# Patient Record
Sex: Female | Born: 1990
Health system: Southern US, Community
[De-identification: ages and names within clinical notes are randomized; demographics above are authoritative.]

---
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
## 2010-02-12 ENCOUNTER — Ambulatory Visit: Payer: Self-pay | Admitting: Advanced Practice Midwife

## 2010-05-29 ENCOUNTER — Encounter: Payer: Self-pay | Admitting: Family Medicine

## 2010-06-20 ENCOUNTER — Encounter: Payer: Self-pay | Admitting: Family Medicine

## 2010-07-31 ENCOUNTER — Inpatient Hospital Stay: Payer: Self-pay

## 2010-08-05 ENCOUNTER — Ambulatory Visit: Payer: Self-pay | Admitting: Anesthesiology

## 2010-08-20 ENCOUNTER — Emergency Department: Payer: Self-pay | Admitting: Emergency Medicine

## 2010-09-22 ENCOUNTER — Emergency Department: Payer: Self-pay | Admitting: Unknown Physician Specialty

## 2011-06-17 ENCOUNTER — Encounter: Payer: Self-pay | Admitting: Psychiatry

## 2011-06-20 ENCOUNTER — Encounter: Payer: Self-pay | Admitting: Psychiatry

## 2011-06-27 ENCOUNTER — Emergency Department: Payer: Self-pay | Admitting: Emergency Medicine

## 2018-09-10 ENCOUNTER — Emergency Department
Admission: EM | Admit: 2018-09-10 | Discharge: 2018-09-10 | Disposition: A | Discharge status: Home / Self Care | Payer: Medicaid Other | Attending: Emergency Medicine | Admitting: Emergency Medicine

## 2018-09-10 ENCOUNTER — Encounter: Payer: Self-pay | Admitting: Emergency Medicine

## 2018-09-10 ENCOUNTER — Other Ambulatory Visit: Payer: Self-pay

## 2018-09-10 DIAGNOSIS — F17210 Nicotine dependence, cigarettes, uncomplicated: Secondary | ICD-10-CM | POA: Insufficient documentation

## 2018-09-10 DIAGNOSIS — Y92410 Unspecified street and highway as the place of occurrence of the external cause: Secondary | ICD-10-CM | POA: Insufficient documentation

## 2018-09-10 DIAGNOSIS — G44311 Acute post-traumatic headache, intractable: Secondary | ICD-10-CM | POA: Insufficient documentation

## 2018-09-10 DIAGNOSIS — M549 Dorsalgia, unspecified: Secondary | ICD-10-CM | POA: Diagnosis not present

## 2018-09-10 DIAGNOSIS — R51 Headache: Secondary | ICD-10-CM | POA: Diagnosis present

## 2018-09-10 DIAGNOSIS — M542 Cervicalgia: Secondary | ICD-10-CM | POA: Insufficient documentation

## 2018-09-10 DIAGNOSIS — Y998 Other external cause status: Secondary | ICD-10-CM | POA: Insufficient documentation

## 2018-09-10 DIAGNOSIS — Y9389 Activity, other specified: Secondary | ICD-10-CM | POA: Diagnosis not present

## 2018-09-10 MED ORDER — IBUPROFEN 800 MG PO TABS: 800.0000 mg | ORAL_TABLET | Freq: Three times a day (TID) | ORAL | 0 refills | Status: DC | PRN

## 2018-09-10 MED ORDER — METHOCARBAMOL 500 MG PO TABS: 500.0000 mg | ORAL_TABLET | Freq: Three times a day (TID) | ORAL | 0 refills | Status: DC | PRN
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
## 2019-08-24 ENCOUNTER — Ambulatory Visit: Payer: Self-pay

## 2019-08-24 ENCOUNTER — Ambulatory Visit
Admission: EM | Admit: 2019-08-24 | Discharge: 2019-08-24 | Disposition: A | Discharge status: Home / Self Care | Payer: Medicaid Other | Attending: Emergency Medicine | Admitting: Emergency Medicine

## 2019-08-24 DIAGNOSIS — J069 Acute upper respiratory infection, unspecified: Secondary | ICD-10-CM | POA: Diagnosis not present

## 2019-08-24 MED ORDER — BENZONATATE 100 MG PO CAPS: 100.0000 mg | ORAL_CAPSULE | Freq: Three times a day (TID) | ORAL | 0 refills | Status: DC | PRN

## 2019-08-24 NOTE — Discharge Instructions (Addendum)
Take the [facility] as needed for cough.    Your rapid COVID test is negative; the send-out test is pending.  You should self quarantine until your test result is back.    Follow up with your primary care provider if your symptoms are not improving.

## 2019-08-24 NOTE — ED Triage Notes (Signed)
Patient reports she was exposed to her cousin who diagnosed with a viral respiratory infection and tested negative for COVID. States she is having cough, chest tightness, and nasal congestion x3 days. Denies fever, sore throat, chest pain.

## 2019-08-24 NOTE — ED Provider Notes (Signed)
[facility]    CSN: [id number] Arrival date & time: 08/24/19  1609      History   Chief Complaint Chief Complaint  Patient presents with   Cough   Chest Tightness   Nasal Congestion    HPI Savannah Montgomery is a 29 y.o. female.   Patient presents with 3-day history of nonproductive cough, chest congestion, nasal congestion.  She denies fever, chills, ear pain, sore throat, shortness of breath, abdominal pain, vomiting, diarrhea, rash, or other symptoms.  Treatment attempted with OTC cold medication.  The history is provided by the patient.    History reviewed. No pertinent past medical history.  There are no problems to display for this patient.   History reviewed. No pertinent surgical history.  OB History   No obstetric history on file.      Home Medications    Prior to Admission medications   Medication Sig Start Date End Date Taking? Authorizing Provider  benzonatate (TESSALON) 100 MG capsule Take 1 capsule (100 mg total) by mouth 3 (three) times daily as needed for cough. 08/24/19   Mickie Bail, NP  ibuprofen (ADVIL) 800 MG tablet Take 1 tablet (800 mg total) by mouth every 8 (eight) hours as needed. 09/10/18   Lorre Munroe, NP  methocarbamol (ROBAXIN) 500 MG tablet Take 1 tablet (500 mg total) by mouth every 8 (eight) hours as needed for muscle spasms. 09/10/18   Lorre Munroe, NP    Family History History reviewed. No pertinent family history.  Social History Social History   Tobacco Use   Smoking status: Current Every Day Smoker    Packs/day: 1.00    Types: Cigarettes   Smokeless tobacco: Never Used  Substance Use Topics   Alcohol use: Not Currently   Drug use: Not on file     Allergies   Patient has no known allergies.   Review of Systems Review of Systems  Constitutional: Negative for chills and fever.  HENT: Positive for congestion, postnasal drip, rhinorrhea and sinus pressure. Negative for ear pain and sore throat.    Eyes: Negative for pain and visual disturbance.  Respiratory: Positive for cough. Negative for shortness of breath.   Cardiovascular: Negative for chest pain and palpitations.  Gastrointestinal: Negative for abdominal pain and vomiting.  Genitourinary: Negative for dysuria and hematuria.  Musculoskeletal: Negative for arthralgias and back pain.  Skin: Negative for color change and rash.  Neurological: Negative for seizures and syncope.  All other systems reviewed and are negative.    Physical Exam Triage Vital Signs ED Triage Vitals [08/24/19 1610]  Enc Vitals Group     BP      Pulse      Resp      Temp      Temp src      SpO2      Weight      Height      Head Circumference      Peak Flow      Pain Score 0     Pain Loc      Pain Edu?      Excl. in [place]?    No data found.  Updated Vital Signs BP 119/83    Pulse (!) 114    Temp 99 F (37.2 C)    Resp 16    SpO2 95%   Visual Acuity Right Eye Distance:   Left Eye Distance:   Bilateral Distance:    Right Eye Near:  Left Eye Near:    Bilateral Near:     Physical Exam Vitals and nursing note reviewed.  Constitutional:      General: She is not in acute distress.    Appearance: She is well-developed. She is not ill-appearing.  HENT:     Head: Normocephalic and atraumatic.     Right Ear: Tympanic membrane normal.     Left Ear: Tympanic membrane normal.     Nose: Nose normal.     Mouth/Throat:     Mouth: Mucous membranes are moist.     Pharynx: Oropharynx is clear.  Eyes:     Conjunctiva/sclera: Conjunctivae normal.  Cardiovascular:     Rate and Rhythm: Normal rate and regular rhythm.     Heart sounds: No murmur heard.   Pulmonary:     Effort: Pulmonary effort is normal. No respiratory distress.     Breath sounds: Normal breath sounds. No wheezing or rhonchi.  Abdominal:     Palpations: Abdomen is soft.     Tenderness: There is no abdominal tenderness. There is no guarding or rebound.  Musculoskeletal:      Cervical back: Neck supple.  Skin:    General: Skin is warm and dry.     Findings: No rash.  Neurological:     General: No focal deficit present.     Mental Status: She is alert and oriented to person, place, and time.     Gait: Gait normal.  Psychiatric:        Mood and Affect: Mood normal.        Behavior: Behavior normal.      UC Treatments / Results  Labs (all labs ordered are listed, but only abnormal results are displayed) Labs Reviewed  POC SARS CORONAVIRUS 2 AG -  ED    EKG   Radiology No results found.  Procedures Procedures (including critical care time)  Medications Ordered in UC Medications - No data to display  Initial Impression / Assessment and Plan / UC Course  I have reviewed the triage vital signs and the nursing notes.  Pertinent labs & imaging results that were available during my care of the patient were reviewed by me and considered in my medical decision making (see chart for details).   Viral URI with cough.  Treating with Tessalon Perles.  POC COVID negative; PCR pending.  Instructed patient to self quarantine until the test result is back and to take Tylenol as needed for fever/discomfort.  Instructed patient to go to the emergency department if she develops high fever, shortness of breath, severe diarrhea, or other concerning symptoms.  Patient agrees with plan of care.    Final Clinical Impressions(s) / UC Diagnoses   Final diagnoses:  Viral URI with cough     Discharge Instructions     Take the Tessalon Perles as needed for cough.    Your rapid COVID test is negative; the send-out test is pending.  You should self quarantine until your test result is back.    Follow up with your primary care provider if your symptoms are not improving.        ED Prescriptions    Medication Sig Dispense Auth. Provider   benzonatate (TESSALON) 100 MG capsule Take 1 capsule (100 mg total) by mouth 3 (three) times daily as needed for cough. 21  capsule Mickie Bail, NP     I have reviewed the PDMP during this encounter.   Mickie Bail, NP 08/24/19 [phone]

## 2019-08-26 LAB — SARS-COV-2, NAA 2 DAY TAT

## 2019-08-26 LAB — NOVEL CORONAVIRUS, NAA: SARS-CoV-2, NAA: NOT DETECTED

## 2019-10-26 ENCOUNTER — Ambulatory Visit
Admission: RE | Admit: 2019-10-26 | Discharge: 2019-10-26 | Disposition: A | Discharge status: Home / Self Care | Payer: Medicaid Other | Source: Ambulatory Visit | Attending: Internal Medicine | Admitting: Internal Medicine

## 2019-10-26 ENCOUNTER — Ambulatory Visit (INDEPENDENT_AMBULATORY_CARE_PROVIDER_SITE_OTHER): Payer: Medicaid Other

## 2019-10-26 ENCOUNTER — Other Ambulatory Visit: Payer: Self-pay

## 2019-10-26 VITALS — BP 133/78 | HR 77 | Temp 98.4°F | Resp 16 | Ht <= 58 in | Wt 125.0 lb

## 2019-10-26 DIAGNOSIS — R1030 Lower abdominal pain, unspecified: Secondary | ICD-10-CM

## 2019-10-26 DIAGNOSIS — M549 Dorsalgia, unspecified: Secondary | ICD-10-CM

## 2019-10-26 DIAGNOSIS — R0789 Other chest pain: Secondary | ICD-10-CM | POA: Diagnosis not present

## 2019-10-26 DIAGNOSIS — S39012A Strain of muscle, fascia and tendon of lower back, initial encounter: Secondary | ICD-10-CM | POA: Diagnosis not present

## 2019-10-26 DIAGNOSIS — S161XXA Strain of muscle, fascia and tendon at neck level, initial encounter: Secondary | ICD-10-CM

## 2019-10-26 DIAGNOSIS — S20211A Contusion of right front wall of thorax, initial encounter: Secondary | ICD-10-CM | POA: Diagnosis not present

## 2019-10-26 DIAGNOSIS — M545 Low back pain, unspecified: Secondary | ICD-10-CM

## 2019-10-26 DIAGNOSIS — M542 Cervicalgia: Secondary | ICD-10-CM

## 2019-10-26 DIAGNOSIS — T148XXA Other injury of unspecified body region, initial encounter: Secondary | ICD-10-CM

## 2019-10-26 DIAGNOSIS — M25511 Pain in right shoulder: Secondary | ICD-10-CM | POA: Diagnosis not present

## 2019-10-26 DIAGNOSIS — R519 Headache, unspecified: Secondary | ICD-10-CM | POA: Diagnosis not present

## 2019-10-26 DIAGNOSIS — S29012A Strain of muscle and tendon of back wall of thorax, initial encounter: Secondary | ICD-10-CM

## 2019-10-26 LAB — URINALYSIS, COMPLETE (UACMP) WITH MICROSCOPIC
Bilirubin Urine: NEGATIVE
Glucose, UA: NEGATIVE mg/dL
Hgb urine dipstick: NEGATIVE
Ketones, ur: NEGATIVE mg/dL
Leukocytes,Ua: NEGATIVE
Nitrite: NEGATIVE
Protein, ur: NEGATIVE mg/dL
RBC / HPF: NONE SEEN RBC/hpf (ref 0–5)
Specific Gravity, Urine: 1.02 (ref 1.005–1.030)
pH: 7.5 (ref 5.0–8.0)

## 2019-10-26 MED ORDER — IBUPROFEN 800 MG PO TABS
800.0000 mg | ORAL_TABLET | Freq: Three times a day (TID) | ORAL | 0 refills | Status: DC | PRN
Start: 2019-10-26 — End: 2021-10-15

## 2019-10-26 MED ORDER — ACETAMINOPHEN 500 MG PO TABS
1000.0000 mg | ORAL_TABLET | Freq: Once | ORAL | Status: AC
  Administered 2019-10-26: 1000 mg via ORAL

## 2019-10-26 MED ORDER — IBUPROFEN 800 MG PO TABS
800.0000 mg | ORAL_TABLET | Freq: Three times a day (TID) | ORAL | 0 refills | Status: DC | PRN
Start: 2019-10-26 — End: 2019-10-26

## 2019-10-26 MED ORDER — METHOCARBAMOL 500 MG PO TABS: 500.0000 mg | ORAL_TABLET | Freq: Three times a day (TID) | ORAL | 0 refills | Status: DC | PRN
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
## 2019-10-26 NOTE — Discharge Instructions (Signed)
Use ice on areas of pain. Try to get physical therapy as soon as you can to heal faster.

## 2020-07-20 ENCOUNTER — Ambulatory Visit: Payer: Medicaid Other
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
## 2021-10-15 ENCOUNTER — Ambulatory Visit
Admission: RE | Admit: 2021-10-15 | Discharge: 2021-10-15 | Disposition: A | Discharge status: Home / Self Care | Payer: Medicaid Other | Source: Ambulatory Visit

## 2021-10-15 ENCOUNTER — Ambulatory Visit (INDEPENDENT_AMBULATORY_CARE_PROVIDER_SITE_OTHER): Payer: Medicaid Other

## 2021-10-15 VITALS — BP 126/77 | HR 82 | Temp 99.0°F | Resp 16

## 2021-10-15 DIAGNOSIS — M25511 Pain in right shoulder: Secondary | ICD-10-CM

## 2021-10-15 DIAGNOSIS — S40011A Contusion of right shoulder, initial encounter: Secondary | ICD-10-CM

## 2021-10-15 DIAGNOSIS — R079 Chest pain, unspecified: Secondary | ICD-10-CM

## 2021-10-15 DIAGNOSIS — S20219A Contusion of unspecified front wall of thorax, initial encounter: Secondary | ICD-10-CM

## 2021-10-15 MED ORDER — BACLOFEN 10 MG PO TABS: 10.0000 mg | ORAL_TABLET | Freq: Three times a day (TID) | ORAL | 0 refills | Status: AC

## 2021-10-15 MED ORDER — IBUPROFEN 600 MG PO TABS: 600.0000 mg | ORAL_TABLET | Freq: Four times a day (QID) | ORAL | 0 refills | Status: AC | PRN
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
## 2023-03-04 ENCOUNTER — Ambulatory Visit
Admission: RE | Admit: 2023-03-04 | Discharge: 2023-03-04 | Disposition: A | Discharge status: Home / Self Care | Payer: Medicaid Other | Source: Ambulatory Visit | Attending: Emergency Medicine

## 2023-03-04 ENCOUNTER — Ambulatory Visit
Admission: RE | Admit: 2023-03-04 | Discharge: 2023-03-04 | Disposition: A | Discharge status: Home / Self Care | Payer: Medicaid Other | Source: Ambulatory Visit | Attending: Emergency Medicine | Admitting: Emergency Medicine

## 2023-03-04 ENCOUNTER — Ambulatory Visit
Admission: RE | Admit: 2023-03-04 | Discharge: 2023-03-04 | Disposition: A | Discharge status: Home / Self Care | Payer: Medicaid Other | Attending: Emergency Medicine | Admitting: Emergency Medicine

## 2023-03-04 ENCOUNTER — Other Ambulatory Visit: Payer: Self-pay

## 2023-03-04 VITALS — BP 135/93 | HR 100 | Temp 98.8°F | Resp 19

## 2023-03-04 DIAGNOSIS — J069 Acute upper respiratory infection, unspecified: Secondary | ICD-10-CM | POA: Insufficient documentation

## 2023-03-04 MED ORDER — AZITHROMYCIN 250 MG PO TABS: 250.0000 mg | ORAL_TABLET | Freq: Every day | ORAL | 0 refills | Status: AC

## 2023-03-04 NOTE — Discharge Instructions (Addendum)
Take the Zithromax as directed.  Follow up with your primary care provider tomorrow.  Go to the emergency department if you have worsening symptoms.

## 2023-03-04 NOTE — ED Triage Notes (Signed)
Pt states she was seen for URI last week on UC and diagnose with respiratory virus. Pt endorses feeling more fatigue and continued to have fever until yesterday.

## 2023-03-04 NOTE — ED Provider Notes (Signed)
[facility]    CSN: [id number] Arrival date & time: 03/04/23  0957      History   Chief Complaint Chief Complaint  Patient presents with   Fever    I was diagnosed with a upper respitory virus last Saturday but im still having fevers everyday, several times a day and it doesn't seem like the congestion is getting any better - Entered by patient    HPI Savannah Montgomery is a 33 y.o. female.  Patient presents with fever, congestion, productive cough, fatigue x 5 days.  Her fever resolved yesterday.  No shortness of breath, vomiting, diarrhea.  She has been treating her symptoms with Tylenol and ibuprofen.  Current everyday smoker.  [facility] and was negative for COVID and flu; diagnosed with viral URI and treated with Tessalon Perles and Promethazine DM.  Her symptoms have gotten worse.  The history is provided by the patient and medical records.    History reviewed. No pertinent past medical history.  There are no active problems to display for this patient.   History reviewed. No pertinent surgical history.  OB History   No obstetric history on file.      Home Medications    Prior to Admission medications   Medication Sig Start Date End Date Taking? Authorizing Provider  azithromycin (ZITHROMAX) 250 MG tablet Take 1 tablet (250 mg total) by mouth daily. Take first 2 tablets together, then 1 every day until finished. 03/04/23  Yes Mickie Bail, NP  baclofen (LIORESAL) 10 MG tablet Take 1 tablet (10 mg total) by mouth 3 (three) times daily. 10/15/21   Becky Augusta, NP  gabapentin (NEURONTIN) 300 MG capsule TAKE 2 CAPSULES BY MOUTH NIGHTLY 09/18/21 03/17/22  [provider]  ibuprofen (ADVIL) 600 MG tablet Take 1 tablet (600 mg total) by mouth every 6 (six) hours as needed. 10/15/21   Becky Augusta, NP  naloxone [facility]) nasal spray 4 mg/0.1 mL SMARTSIG:Both Nares 08/29/21   [provider]  phentermine 30 MG  capsule Take 30 mg by mouth every morning. 09/25/21   [provider]  SUBOXONE 8-2 MG FILM SMARTSIG:1 Strip(s) Sublingual 4 Times Daily 09/25/21   [provider]    Family History Family History  Problem Relation Age of Onset   Healthy Neg Hx    Rheum arthritis Neg Hx    Osteoarthritis Neg Hx    Asthma Neg Hx    Cancer Neg Hx    Heart failure Neg Hx    Hyperlipidemia Neg Hx    Diabetes Neg Hx    Hypertension Neg Hx    Migraines Neg Hx    Rashes / Skin problems Neg Hx    Seizures Neg Hx    Stroke Neg Hx    Thyroid disease Neg Hx     Social History Social History   Tobacco Use   Smoking status: Every Day    Current packs/day: 1.00    Types: Cigarettes   Smokeless tobacco: Never  Vaping Use   Vaping status: Never Used  Substance Use Topics   Alcohol use: Not Currently   Drug use: Not Currently     Allergies   Patient has no known allergies.   Review of Systems Review of Systems  Constitutional:  Positive for fever. Negative for chills.  HENT:  Positive for congestion. Negative for ear pain and sore throat.   Respiratory:  Positive for cough. Negative for shortness  of breath.   Gastrointestinal:  Negative for diarrhea and vomiting.     Physical Exam Triage Vital Signs ED Triage Vitals  Encounter Vitals Group     BP      Systolic BP Percentile      Diastolic BP Percentile      Pulse      Resp      Temp      Temp src      SpO2      Weight      Height      Head Circumference      Peak Flow      Pain Score      Pain Loc      Pain Education      Exclude from Growth Chart    No data found.  Updated Vital Signs BP (!) 135/93 (BP Location: Right Arm)   Pulse 100   Temp 98.8 F (37.1 C) (Oral)   Resp 19   LMP 02/28/2023 (Approximate)   SpO2 95%   Visual Acuity Right Eye Distance:   Left Eye Distance:   Bilateral Distance:    Right Eye Near:   Left Eye Near:    Bilateral Near:     Physical Exam Constitutional:       General: She is not in acute distress. HENT:     Right Ear: Tympanic membrane normal.     Left Ear: Tympanic membrane normal.     Nose: Nose normal.     Mouth/Throat:     Mouth: Mucous membranes are moist.     Pharynx: Oropharynx is clear.  Cardiovascular:     Rate and Rhythm: Normal rate and regular rhythm.     Heart sounds: Normal heart sounds.  Pulmonary:     Effort: Pulmonary effort is normal. No respiratory distress.     Breath sounds: Rhonchi present.     Comments: Bilateral rhonchi Neurological:     Mental Status: She is alert.      UC Treatments / Results  Labs (all labs ordered are listed, but only abnormal results are displayed) Labs Reviewed - No data to display  EKG   Radiology DG Chest 2 View Result Date: 03/04/2023 CLINICAL DATA:  Cough, congestion, and shortness of breath. EXAM: CHEST - 2 VIEW COMPARISON:  Chest x-ray dated October 15, 2021. FINDINGS: The heart size and mediastinal contours are within normal limits. Both lungs are clear. The visualized skeletal structures are unremarkable. IMPRESSION: No active cardiopulmonary disease. Electronically Signed   By: Obie Dredge M.D.   On: 03/04/2023 14:12    Procedures Procedures (including critical care time)  Medications Ordered in UC Medications - No data to display  Initial Impression / Assessment and Plan / UC Course  I have reviewed the triage vital signs and the nursing notes.  Pertinent labs & imaging results that were available during my care of the patient were reviewed by me and considered in my medical decision making (see chart for details).    Acute upper respiratory infection.  Chest x-ray negative.  Treating with Zithromax.  Tylenol or ibuprofen as needed.  Plain Mucinex as needed.  ED precautions discussed.  Education provided on upper respiratory infection.  Instructed patient to follow-up with her PCP tomorrow.  Education provided on upper respiratory infection.  Patient agrees to  plan of care.  Final Clinical Impressions(s) / UC Diagnoses   Final diagnoses:  Acute upper respiratory infection     Discharge Instructions  Take the Zithromax as directed.  Follow up with your primary care provider tomorrow.  Go to the emergency department if you have worsening symptoms.        ED Prescriptions     Medication Sig Dispense Auth. Provider   azithromycin (ZITHROMAX) 250 MG tablet Take 1 tablet (250 mg total) by mouth daily. Take first 2 tablets together, then 1 every day until finished. 6 tablet Mickie Bail, NP      PDMP not reviewed this encounter.   Mickie Bail, NP 03/04/23 1426

## 2023-04-07 ENCOUNTER — Ambulatory Visit: Payer: Self-pay

## 2023-09-17 ENCOUNTER — Ambulatory Visit: Payer: Self-pay
